# Patient Record
Sex: Female | Born: 1965 | Race: Asian | Hispanic: No | Marital: Married | Smoking: Never smoker
Health system: Southern US, Community
[De-identification: ages and names within clinical notes are randomized; demographics above are authoritative.]

## PROBLEM LIST (undated history)

## (undated) DIAGNOSIS — I10 Essential (primary) hypertension: Secondary | ICD-10-CM

## (undated) HISTORY — DX: Essential (primary) hypertension: I10

---

## 2019-09-28 ENCOUNTER — Ambulatory Visit: Payer: Self-pay | Admitting: Family Medicine

## 2019-10-26 ENCOUNTER — Encounter: Payer: Self-pay | Admitting: Family Medicine

## 2019-10-26 ENCOUNTER — Ambulatory Visit: Payer: Commercial Managed Care - PPO | Admitting: Family Medicine

## 2019-10-26 ENCOUNTER — Ambulatory Visit
Admission: RE | Admit: 2019-10-26 | Discharge: 2019-10-26 | Disposition: A | Discharge status: Home / Self Care | Payer: Commercial Managed Care - PPO | Source: Ambulatory Visit | Attending: Family Medicine | Admitting: Family Medicine

## 2019-10-26 ENCOUNTER — Other Ambulatory Visit: Payer: Self-pay

## 2019-10-26 VITALS — BP 168/102 | HR 71 | Temp 97.9°F | Resp 16 | Ht 59.0 in | Wt 117.4 lb

## 2019-10-26 DIAGNOSIS — Z1231 Encounter for screening mammogram for malignant neoplasm of breast: Secondary | ICD-10-CM

## 2019-10-26 DIAGNOSIS — I10 Essential (primary) hypertension: Secondary | ICD-10-CM | POA: Diagnosis not present

## 2019-10-26 DIAGNOSIS — N926 Irregular menstruation, unspecified: Secondary | ICD-10-CM

## 2019-10-26 DIAGNOSIS — Z789 Other specified health status: Secondary | ICD-10-CM | POA: Diagnosis not present

## 2019-10-26 DIAGNOSIS — N951 Menopausal and female climacteric states: Secondary | ICD-10-CM | POA: Diagnosis not present

## 2019-10-26 MED ORDER — AMLODIPINE BESYLATE 10 MG PO TABS: 10.0000 mg | ORAL_TABLET | Freq: Every day | ORAL | 0 refills | Status: DC

## 2019-10-26 NOTE — Assessment & Plan Note (Signed)
questioning whether or not she is in menopause she has been having irregular.  Hot flashes and inability to sleep well.  [facility]

## 2019-10-26 NOTE — Assessment & Plan Note (Signed)
[facility] 

## 2019-10-26 NOTE — Assessment & Plan Note (Signed)
[place] is second language.  Some confusion and trying to ask questions versus answering what she felt was the answer she needed to say.  She does understand enough English that she cannot function, but will need extra information to make sure she understands.

## 2019-10-26 NOTE — Patient Instructions (Signed)
I appreciate the opportunity to provide you with care for your health and wellness. Today we discussed: establish care   Follow up: 3 months for BP  No labs  Referrals today: GI, and [facility] Orders: Mammogram  Nice to meet you today :)  Please continue to practice social distancing to keep you, your family, and our community safe.  If you must go out, please wear a mask and practice good handwashing.  It was a pleasure to see you and I look forward to continuing to work together on your health and well-being. Please do not hesitate to call the office if you need care or have questions about your care.  Have a wonderful day and week. With Gratitude, Tereasa Coop, DNP, AGNP-BC

## 2019-10-26 NOTE — Assessment & Plan Note (Signed)
Andrea Maldonado is encouraged to maintain a well balanced diet that is low in salt. Not well controlled, increase Norvasc.  Follow-up in 3 months.  Will try to to increase hydrochlorothiazide at that time if still elevated.  I have encouraged her to exercise is beneficial for heart health and control of Blood pressure. 30-60 minutes daily is recommended-walking was suggested.

## 2019-10-26 NOTE — Progress Notes (Signed)
Subjective:  Patient ID: Andrea Maldonado, female    DOB: June 20, 1965  Age: 54 y.o. MRN: [id number]  CC:  Chief Complaint  Patient presents with  . New Patient (Initial Visit)    new pt saw health dept due to no insurance at the time blood pressure is high and she feels like she is going through menopause having irregular periods and hot flashes       HPI  HPI  Andrea Maldonado is a 54 year old female patient.  She presents today to establish care.  Previously was seen at [facility].  Has a history that is very limited in nature hypertension is currently taking Norvasc and hydrochlorothiazide for this.  Still has elevation today at visit.  She reports this is because she works night shift and just got off work and she is tired.  She reports she thinks she is going through menopause as her periods are now irregular, hot flashes, inability to sleep well.  [facility]  She is due for colonoscopy mammogram those are ordered and scheduled today.  She reports trouble hearing out of her left ear but is unsure why it is like that.  She wears glasses has seen eye doctor recently they are up-to-date.  She denies having any trouble with her teeth however she is not seen a dentist.  Denies having any changes in bowel or bladder habits.  Any skin issues or memory issues.   Today patient denies signs and symptoms of COVID 19 infection including fever, chills, cough, shortness of breath, and headache. Past Medical, Surgical, Social History, Allergies, and Medications have been Reviewed.   Past Medical History:  Diagnosis Date  . Hypertension     Current Meds  Medication Sig  . amLODipine (NORVASC) 10 MG tablet Take 1 tablet (10 mg total) by mouth daily.  . hydrochlorothiazide (MICROZIDE) 12.5 MG capsule Take 12.5 mg by mouth daily.  . [DISCONTINUED] amLODipine (NORVASC) 5 MG tablet Take 5 mg by mouth 2 (two) times daily.    ROS:    Review of Systems  Constitutional: Negative.   HENT: Negative.   Eyes: Negative.   Respiratory: Negative.   Cardiovascular: Negative.   Gastrointestinal: Negative.   Genitourinary: Negative.   Musculoskeletal: Negative.   Skin: Negative.   Neurological: Negative.        Hot flashes  Endo/Heme/Allergies: Negative.   Psychiatric/Behavioral: The patient has insomnia.   All other systems reviewed and are negative.    Objective:   Today's Vitals: BP (!) 168/102   Pulse 71   Temp 97.9 F (36.6 C) (Temporal)   Resp 16   Ht 4\' 11"  (1.499 m)   Wt 117 lb 6.4 oz (53.3 kg)   LMP 08/02/2019 (Approximate)   SpO2 99%   BMI 23.71 kg/m  Vitals with BMI 10/26/2019 10/26/2019  Height - 4\' 11"   Weight - 117 lbs 6 oz  BMI - 23.7  Systolic 168 172  Diastolic 102 106  Pulse - 71     Physical Exam Vitals and nursing note reviewed.  Constitutional:      Appearance: Normal appearance. She is well-developed, well-groomed and normal weight.  HENT:     Head: Normocephalic and atraumatic.     Right Ear: External ear normal.     Left Ear: External ear normal.     Mouth/Throat:     Comments: Mask in place  Eyes:     General:  Right eye: No discharge.        Left eye: No discharge.     Conjunctiva/sclera: Conjunctivae normal.  Cardiovascular:     Rate and Rhythm: Normal rate and regular rhythm.     Pulses: Normal pulses.     Heart sounds: Normal heart sounds.  Pulmonary:     Effort: Pulmonary effort is normal.     Breath sounds: Normal breath sounds.  Musculoskeletal:        General: Normal range of motion.     Cervical back: Normal range of motion and neck supple.  Skin:    General: Skin is warm.  Neurological:     General: No focal deficit present.     Mental Status: She is alert and oriented to person, place, and time.  Psychiatric:        Attention and Perception: Attention normal.        Mood and Affect: Mood normal.        Speech: Speech normal.        Behavior:  Behavior normal. Behavior is cooperative.        Thought Content: Thought content normal.        Cognition and Memory: Cognition normal.        Judgment: Judgment normal.     Assessment   1. Essential hypertension   2. Screening mammogram, encounter for   3. Language barrier   4. Hot flashes due to menopause   5. Irregular periods     Tests ordered Orders Placed This Encounter  Procedures  . MM Digital Screening     Plan: Please see assessment and plan per problem list above.   Meds ordered this encounter  Medications  . amLODipine (NORVASC) 10 MG tablet    Sig: Take 1 tablet (10 mg total) by mouth daily.    Dispense:  90 tablet    Refill:  0    Order Specific Question:   Supervising Provider    Answer:   Kerri Perches [[id number]]    Patient to follow-up in 3 months for BP .  Andrea Finner, NP

## 2019-10-29 ENCOUNTER — Encounter (INDEPENDENT_AMBULATORY_CARE_PROVIDER_SITE_OTHER): Payer: Self-pay | Admitting: *Deleted

## 2019-11-15 ENCOUNTER — Encounter: Payer: Self-pay | Admitting: Obstetrics & Gynecology

## 2019-11-15 ENCOUNTER — Ambulatory Visit: Payer: Commercial Managed Care - PPO | Admitting: Obstetrics & Gynecology

## 2019-11-15 VITALS — BP 161/89 | HR 74 | Ht 59.0 in | Wt 118.0 lb

## 2019-11-15 DIAGNOSIS — N951 Menopausal and female climacteric states: Secondary | ICD-10-CM

## 2019-11-15 MED ORDER — PROGESTERONE 200 MG PO CAPS: 200.0000 mg | ORAL_CAPSULE | Freq: Every day | ORAL | 0 refills | Status: DC

## 2019-11-15 NOTE — Progress Notes (Signed)
Chief Complaint  Patient presents with  . Menopause symptoms    hot flashes, trouble sleeping, irregular bleeding      54 y.o. No obstetric history on file. Patient's last menstrual period was 10/02/2019. The current method of family planning is none.  Outpatient Encounter Medications as of 11/15/2019  Medication Sig  . amLODipine (NORVASC) 10 MG tablet Take 1 tablet (10 mg total) by mouth daily.  . hydrochlorothiazide (MICROZIDE) 12.5 MG capsule Take 12.5 mg by mouth daily.  . progesterone (PROMETRIUM) 200 MG capsule Take 1 capsule (200 mg total) by mouth daily. At bedtime   No facility-administered encounter medications on file as of 11/15/2019.    Subjective Pt with several months of increasing vasomotor symtpoms and menopausal symptoms irreguylar bleeding the last couple of years, 2-3 menses per year Worsening  Past Medical History:  Diagnosis Date  . Hypertension     History reviewed. No pertinent surgical history.  OB History   No obstetric history on file.     Not on File  Social History   Socioeconomic History  . Marital status: Married    Spouse name: Fayrene Fearing   . Number of children: 1  . Years of education: Not on file  . Highest education level: Not on file  Occupational History    Comment: Paediatric nurse   Tobacco Use  . Smoking status: Never Smoker  . Smokeless tobacco: Never Used  Vaping Use  . Vaping Use: Never used  Substance and Sexual Activity  . Alcohol use: Never  . Drug use: Never  . Sexual activity: Yes    Birth control/protection: None  Other Topics Concern  . Not on file  Social History Narrative   Lives with Fayrene Fearing husband of 6 years    Daughter lives in [place]       Enjoys: movies       Diet: eats food groups    Caffeine: 2 cups coffee, tea once a day but not every day   Water: 4 cups daily       Wears seat belt   Does not drive    Smoke detectors    No weapons    Social Determinants of Health    Financial Resource Strain: Low Risk   . Difficulty of Paying Living Expenses: Not hard at all  Food Insecurity: No Food Insecurity  . Worried About Programme researcher, broadcasting/film/video in the Last Year: Never true  . Ran Out of Food in the Last Year: Never true  Transportation Needs: No Transportation Needs  . Lack of Transportation (Medical): No  . Lack of Transportation (Non-Medical): No  Physical Activity: Insufficiently Active  . Days of Exercise per Week: 3 days  . Minutes of Exercise per Session: 30 min  Stress: No Stress Concern Present  . Feeling of Stress : Not at all  Social Connections: Moderately Integrated  . Frequency of Communication with Friends and Family: More than three times a week  . Frequency of Social Gatherings with Friends and Family: More than three times a week  . Attends Religious Services: More than 4 times per year  . Active Member of Clubs or Organizations: No  . Attends Banker Meetings: Never  . Marital Status: Married    Family History  Problem Relation Age of Onset  . Breast cancer Mother     Medications:       Current Outpatient Medications:  .  amLODipine (NORVASC) 10 MG tablet, Take  1 tablet (10 mg total) by mouth daily., Disp: 90 tablet, Rfl: 0 .  hydrochlorothiazide (MICROZIDE) 12.5 MG capsule, Take 12.5 mg by mouth daily., Disp: , Rfl:  .  progesterone (PROMETRIUM) 200 MG capsule, Take 1 capsule (200 mg total) by mouth daily. At bedtime, Disp: 30 capsule, Rfl: 0  Objective Blood pressure (!) 161/89, pulse 74, height 4\' 11"  (1.499 m), weight 118 lb (53.5 kg), last menstrual period 10/02/2019.  Gen WDWN NAD  Pertinent ROS No burning with urination, frequency or urgency No nausea, vomiting or diarrhea Nor fever chills or other constitutional symptoms   Labs or studies     Impression Diagnoses this Encounter:: No diagnosis found.  Established relevant diagnosis(es):   Plan/Recommendations: Meds ordered this encounter   Medications  . progesterone (PROMETRIUM) 200 MG capsule    Sig: Take 1 capsule (200 mg total) by mouth daily. At bedtime    Dispense:  30 capsule    Refill:  0    Labs or Scans Ordered: No orders of the defined types were placed in this encounter.   Management:: Prometrium for endometrial synchonization  Then  Will begin HRT, estrogen plus progesterone  Follow up Return in about 6 weeks (around 12/27/2019) for Follow up, with Dr 02/26/2020.        Face to face time:  20 minutes  Greater than 50% of the visit time was spent in counseling and coordination of care with the patient.  The summary and outline of the counseling and care coordination is summarized in the note above.   All questions were answered.

## 2019-12-28 ENCOUNTER — Ambulatory Visit: Payer: Commercial Managed Care - PPO | Admitting: Obstetrics & Gynecology

## 2020-01-04 ENCOUNTER — Encounter: Payer: Self-pay | Admitting: Obstetrics & Gynecology

## 2020-01-04 ENCOUNTER — Ambulatory Visit (INDEPENDENT_AMBULATORY_CARE_PROVIDER_SITE_OTHER): Payer: Commercial Managed Care - PPO | Admitting: Obstetrics & Gynecology

## 2020-01-04 VITALS — BP 145/89 | HR 73 | Wt 119.0 lb

## 2020-01-04 DIAGNOSIS — N951 Menopausal and female climacteric states: Secondary | ICD-10-CM | POA: Diagnosis not present

## 2020-01-04 MED ORDER — PROGESTERONE 200 MG PO CAPS
200.0000 mg | ORAL_CAPSULE | Freq: Every day | ORAL | 11 refills | Status: DC
Start: 2020-01-04 — End: 2023-12-01

## 2020-01-04 MED ORDER — ESTRADIOL 2 MG PO TABS: 2.0000 mg | ORAL_TABLET | Freq: Every day | ORAL | 11 refills | Status: DC

## 2020-01-04 NOTE — Progress Notes (Signed)
Chief Complaint  Patient presents with  . Follow-up    still having hot flashes      54 y.o. No obstetric history on file. No LMP recorded. (Menstrual status: Perimenopausal). The current method of family planning is post menopausal status.  Outpatient Encounter Medications as of 01/04/2020  Medication Sig  . amLODipine (NORVASC) 10 MG tablet Take 1 tablet (10 mg total) by mouth daily.  . hydrochlorothiazide (MICROZIDE) 12.5 MG capsule Take 12.5 mg by mouth daily.  . [DISCONTINUED] progesterone (PROMETRIUM) 200 MG capsule Take 1 capsule (200 mg total) by mouth daily. At bedtime  . estradiol (ESTRACE) 2 MG tablet Take 1 tablet (2 mg total) by mouth daily.  . progesterone (PROMETRIUM) 200 MG capsule Take 1 capsule (200 mg total) by mouth daily. At bedtime   No facility-administered encounter medications on file as of 01/04/2020.    Subjective Pt has stopped bleeding on prometrium hot flashes continue No other complaints Past Medical History:  Diagnosis Date  . Hypertension     History reviewed. No pertinent surgical history.  OB History   No obstetric history on file.     No Known Allergies  Social History   Socioeconomic History  . Marital status: Married    Spouse name: Fayrene Fearing   . Number of children: 1  . Years of education: Not on file  . Highest education level: Not on file  Occupational History    Comment: Paediatric nurse   Tobacco Use  . Smoking status: Never Smoker  . Smokeless tobacco: Never Used  Vaping Use  . Vaping Use: Never used  Substance and Sexual Activity  . Alcohol use: Never  . Drug use: Never  . Sexual activity: Yes    Birth control/protection: None  Other Topics Concern  . Not on file  Social History Narrative   Lives with Fayrene Fearing husband of 6 years    Daughter lives in [place]       Enjoys: movies       Diet: eats food groups    Caffeine: 2 cups coffee, tea once a day but not every day   Water: 4 cups daily        Wears seat belt   Does not drive    Smoke detectors    No weapons    Social Determinants of Health   Financial Resource Strain: Low Risk   . Difficulty of Paying Living Expenses: Not hard at all  Food Insecurity: No Food Insecurity  . Worried About Programme researcher, broadcasting/film/video in the Last Year: Never true  . Ran Out of Food in the Last Year: Never true  Transportation Needs: No Transportation Needs  . Lack of Transportation (Medical): No  . Lack of Transportation (Non-Medical): No  Physical Activity: Insufficiently Active  . Days of Exercise per Week: 3 days  . Minutes of Exercise per Session: 30 min  Stress: No Stress Concern Present  . Feeling of Stress : Not at all  Social Connections: Moderately Integrated  . Frequency of Communication with Friends and Family: More than three times a week  . Frequency of Social Gatherings with Friends and Family: More than three times a week  . Attends Religious Services: More than 4 times per year  . Active Member of Clubs or Organizations: No  . Attends Banker Meetings: Never  . Marital Status: Married    Family History  Problem Relation Age of Onset  . Breast cancer Mother  Medications:       Current Outpatient Medications:  .  amLODipine (NORVASC) 10 MG tablet, Take 1 tablet (10 mg total) by mouth daily., Disp: 90 tablet, Rfl: 0 .  hydrochlorothiazide (MICROZIDE) 12.5 MG capsule, Take 12.5 mg by mouth daily., Disp: , Rfl:  .  estradiol (ESTRACE) 2 MG tablet, Take 1 tablet (2 mg total) by mouth daily., Disp: 30 tablet, Rfl: 11 .  progesterone (PROMETRIUM) 200 MG capsule, Take 1 capsule (200 mg total) by mouth daily. At bedtime, Disp: 30 capsule, Rfl: 11  Objective Blood pressure (!) 145/89, pulse 73, weight 119 lb (54 kg).  Gen WDWN NAD  Pertinent ROS No burning with urination, frequency or urgency No nausea, vomiting or diarrhea Nor fever chills or other constitutional symptoms   Labs or  studies     Impression Diagnoses this Encounter::   ICD-10-CM   1. Menopausal symptoms  N95.1     Established relevant diagnosis(es):   Plan/Recommendations: Meds ordered this encounter  Medications  . estradiol (ESTRACE) 2 MG tablet    Sig: Take 1 tablet (2 mg total) by mouth daily.    Dispense:  30 tablet    Refill:  11  . progesterone (PROMETRIUM) 200 MG capsule    Sig: Take 1 capsule (200 mg total) by mouth daily. At bedtime    Dispense:  30 capsule    Refill:  11    Labs or Scans Ordered: No orders of the defined types were placed in this encounter.   Management:: Bleeding managed effectively with the prometrium only  Now  Will transition to managing her vasomotor symptoms with estradiol 2 mg daily and prometrium 200 mg daily  Follow up No follow-ups on file.        Face to face time:  10 minutes  Greater than 50% of the visit time was spent in counseling and coordination of care with the patient.  The summary and outline of the counseling and care coordination is summarized in the note above.   All questions were answered.

## 2020-01-29 ENCOUNTER — Encounter: Payer: Self-pay | Admitting: Family Medicine

## 2020-01-29 ENCOUNTER — Ambulatory Visit (INDEPENDENT_AMBULATORY_CARE_PROVIDER_SITE_OTHER): Payer: Commercial Managed Care - PPO | Admitting: Family Medicine

## 2020-01-29 ENCOUNTER — Other Ambulatory Visit: Payer: Self-pay

## 2020-01-29 VITALS — BP 167/117 | HR 68 | Temp 97.9°F | Ht 59.0 in | Wt 120.0 lb

## 2020-01-29 DIAGNOSIS — I10 Essential (primary) hypertension: Secondary | ICD-10-CM

## 2020-01-29 MED ORDER — LISINOPRIL-HYDROCHLOROTHIAZIDE 10-12.5 MG PO TABS: 1.0000 | ORAL_TABLET | Freq: Every day | ORAL | 3 refills | Status: DC

## 2020-01-29 NOTE — Progress Notes (Signed)
Subjective:  Patient ID: Andrea Maldonado, female    DOB: April 13, 1965  Age: 54 y.o. MRN: [id number]  CC:  Chief Complaint  Patient presents with  . Follow-up    b/p      HPI  HPI  Andrea Maldonado is here for follow up on BP. She still has a elevation, but reports not eating salt in her diet and taking medication as directed. Exercising not really outside of working. Blood pressure is not checked at home.  Risk factors: hypertension. Use of agents associated with hypertension: estrogens. History of target organ damage: none. Concerned about not seeing an improvement post medication adjustment. She is willing to have me adjust the medications again.  She recently was started on estrace due to menopausal symptoms. I am concerned and will look at stopping this if no improvement seen in future.  Today patient denies signs and symptoms of COVID 19 infection including fever, chills, cough, shortness of breath, and headache. Past Medical, Surgical, Social History, Allergies, and Medications have been Reviewed.   Past Medical History:  Diagnosis Date  . Hypertension     Current Meds  Medication Sig  . amLODipine (NORVASC) 10 MG tablet Take 1 tablet (10 mg total) by mouth daily.  Marland Kitchen estradiol (ESTRACE) 2 MG tablet Take 1 tablet (2 mg total) by mouth daily.  . progesterone (PROMETRIUM) 200 MG capsule Take 1 capsule (200 mg total) by mouth daily. At bedtime  . [DISCONTINUED] hydrochlorothiazide (MICROZIDE) 12.5 MG capsule Take 12.5 mg by mouth daily.    ROS:  Review of Systems  Constitutional: Negative.   HENT: Negative.   Eyes: Negative.   Respiratory: Negative.   Cardiovascular: Negative.   Gastrointestinal: Negative.   Genitourinary: Negative.   Musculoskeletal: Negative.   Skin: Negative.   Neurological: Negative.   Endo/Heme/Allergies: Negative.   Psychiatric/Behavioral: Negative.      Objective:   Today's Vitals: BP (!) 167/117 (BP Location: Left Arm, Patient Position:  Sitting, Cuff Size: Normal)   Pulse 68   Temp 97.9 F (36.6 C) (Tympanic)   Ht 4\' 11"  (1.499 m)   Wt 120 lb (54.4 kg)   SpO2 97%   BMI 24.24 kg/m  Vitals with BMI 01/29/2020 01/04/2020 11/15/2019  Height 4\' 11"  - 4\' 11"   Weight 120 lbs 119 lbs 118 lbs  BMI 24.22 - 23.82  Systolic 167 145 11/17/2019  Diastolic 117 89 89  Pulse 68 73 74     Physical Exam Vitals and nursing note reviewed.  Constitutional:      Appearance: Normal appearance. She is well-developed, well-groomed and normal weight.  HENT:     Head: Normocephalic and atraumatic.     Right Ear: External ear normal.     Left Ear: External ear normal.     Mouth/Throat:     Comments: Mask in place  Eyes:     General:        Right eye: No discharge.        Left eye: No discharge.     Conjunctiva/sclera: Conjunctivae normal.  Cardiovascular:     Rate and Rhythm: Normal rate and regular rhythm.     Pulses: Normal pulses.     Heart sounds: Normal heart sounds.  Pulmonary:     Effort: Pulmonary effort is normal.     Breath sounds: Normal breath sounds.  Musculoskeletal:        General: Normal range of motion.     Cervical back: Normal range of motion and neck  supple.  Skin:    General: Skin is warm.  Neurological:     General: No focal deficit present.     Mental Status: She is alert and oriented to person, place, and time.  Psychiatric:        Attention and Perception: Attention normal.        Mood and Affect: Mood normal.        Speech: Speech normal.        Behavior: Behavior normal. Behavior is cooperative.        Thought Content: Thought content normal.        Cognition and Memory: Cognition normal.        Judgment: Judgment normal.     Assessment   1. Essential hypertension     Tests ordered No orders of the defined types were placed in this encounter.  Plan: Please see assessment and plan per problem list above.   Meds ordered this encounter  Medications  . lisinopril-hydrochlorothiazide  (ZESTORETIC) 10-12.5 MG tablet    Sig: Take 1 tablet by mouth daily.    Dispense:  90 tablet    Refill:  3    Order Specific Question:   Supervising Provider    Answer:   MARILLA, BODDY [[id number]]    Patient to follow-up in 03/18/2020  Note: This dictation was prepared with Dragon dictation along with smaller phrase technology. Similar sounding words can be transcribed inadequately or may not be corrected upon review. Any transcriptional errors that result from this process are unintentional.      Freddy Finner, NP

## 2020-01-29 NOTE — Patient Instructions (Signed)
  HAPPY FALL!  I appreciate the opportunity to provide you with care for your health and wellness. Today we discussed: Blood pressure  Follow up: 6-8 weeks for BP check  No labs or referrals today  Medication changes: Please review the one we stop and the new one we started  Please continue to practice social distancing to keep you, your family, and our community safe.  If you must go out, please wear a mask and practice good handwashing.  It was a pleasure to see you and I look forward to continuing to work together on your health and well-being. Please do not hesitate to call the office if you need care or have questions about your care.  Have a wonderful day and week. With Gratitude, Tereasa Coop, DNP, AGNP-BC

## 2020-01-30 NOTE — Assessment & Plan Note (Signed)
Starting zestoretic to add to norvasc. If no improvement will look at increase dosage and stopping recently started estrace to prevent complications in the future. I encouraged exercise and continuous focus on diet. Additionally, I will be getting updated labs at next visit.

## 2020-02-10 ENCOUNTER — Emergency Department (HOSPITAL_COMMUNITY)
Admission: EM | Admit: 2020-02-10 | Discharge: 2020-02-10 | Disposition: A | Discharge status: Home / Self Care | Payer: Commercial Managed Care - PPO | Attending: Emergency Medicine | Admitting: Emergency Medicine

## 2020-02-10 ENCOUNTER — Emergency Department (HOSPITAL_COMMUNITY): Payer: Commercial Managed Care - PPO

## 2020-02-10 ENCOUNTER — Encounter (HOSPITAL_COMMUNITY): Payer: Self-pay

## 2020-02-10 ENCOUNTER — Other Ambulatory Visit: Payer: Self-pay

## 2020-02-10 DIAGNOSIS — I10 Essential (primary) hypertension: Secondary | ICD-10-CM | POA: Insufficient documentation

## 2020-02-10 DIAGNOSIS — N946 Dysmenorrhea, unspecified: Secondary | ICD-10-CM | POA: Diagnosis not present

## 2020-02-10 DIAGNOSIS — N939 Abnormal uterine and vaginal bleeding, unspecified: Secondary | ICD-10-CM | POA: Insufficient documentation

## 2020-02-10 DIAGNOSIS — Z79899 Other long term (current) drug therapy: Secondary | ICD-10-CM | POA: Insufficient documentation

## 2020-02-10 LAB — URINALYSIS, ROUTINE W REFLEX MICROSCOPIC

## 2020-02-10 LAB — I-STAT BETA HCG BLOOD, ED (MC, WL, AP ONLY): I-stat hCG, quantitative: <5 m[IU]/mL (ref <5)

## 2020-02-10 LAB — COMPREHENSIVE METABOLIC PANEL
ALT: 20 U/L (ref 0–44)
AST: 24 U/L (ref 15–41)
Albumin: 4.3 g/dL (ref 3.5–5.0)
Alkaline Phosphatase: 62 U/L (ref 38–126)
Anion gap: 8 (ref 5–15)
BUN: 14 mg/dL (ref 6–20)
CO2: 28 mmol/L (ref 22–32)
Calcium: 9.3 mg/dL (ref 8.9–10.3)
Chloride: 102 mmol/L (ref 98–111)
Creatinine, Ser: 0.63 mg/dL (ref 0.44–1.00)
GFR, Estimated: >60 mL/min (ref >60)
Glucose, Bld: 104 mg/dL — ABNORMAL HIGH (ref 70–99)
Potassium: 3.7 mmol/L (ref 3.5–5.1)
Sodium: 138 mmol/L (ref 135–145)
Total Bilirubin: 0.8 mg/dL (ref 0.3–1.2)
Total Protein: 8 g/dL (ref 6.5–8.1)

## 2020-02-10 LAB — CBC WITH DIFFERENTIAL/PLATELET
Abs Immature Granulocytes: 0.02 10*3/uL (ref 0.00–0.07)
Basophils Absolute: 0.1 10*3/uL (ref 0.0–0.1)
Basophils Relative: 1 %
Eosinophils Absolute: 0.2 10*3/uL (ref 0.0–0.5)
Eosinophils Relative: 2 %
HCT: 43.1 % (ref 36.0–46.0)
Hemoglobin: 14.7 g/dL (ref 12.0–15.0)
Immature Granulocytes: 0 %
Lymphocytes Relative: 20 %
Lymphs Abs: 1.8 10*3/uL (ref 0.7–4.0)
MCH: 31.1 pg (ref 26.0–34.0)
MCHC: 34.1 g/dL (ref 30.0–36.0)
MCV: 91.3 fL (ref 80.0–100.0)
Monocytes Absolute: 0.4 10*3/uL (ref 0.1–1.0)
Monocytes Relative: 4 %
Neutro Abs: 6.5 10*3/uL (ref 1.7–7.7)
Neutrophils Relative %: 73 %
Platelets: 466 10*3/uL — ABNORMAL HIGH (ref 150–400)
RBC: 4.72 MIL/uL (ref 3.87–5.11)
RDW: 11.3 % — ABNORMAL LOW (ref 11.5–15.5)
WBC: 8.9 10*3/uL (ref 4.0–10.5)
nRBC: 0 % (ref 0.0–0.2)

## 2020-02-10 LAB — URINALYSIS, MICROSCOPIC (REFLEX): RBC / HPF: >50 RBC/hpf (ref 0–5)

## 2020-02-10 MED ORDER — KETOROLAC TROMETHAMINE 60 MG/2ML IM SOLN
60.0000 mg | Freq: Once | INTRAMUSCULAR | Status: AC
  Administered 2020-02-10: 60 mg via INTRAMUSCULAR
  Filled 2020-02-10: qty 2

## 2020-02-10 NOTE — ED Notes (Signed)
Pt given breakfast tray

## 2020-02-10 NOTE — ED Notes (Signed)
To US

## 2020-02-10 NOTE — Discharge Instructions (Signed)
Ibuprofen for cramps.  Call Dr. Forestine Chute office to schedule appointment for recheck

## 2020-02-10 NOTE — ED Provider Notes (Signed)
[facility] EMERGENCY DEPARTMENT Provider Note   CSN: [id number] Arrival date & time: 02/10/20  2426     History Chief Complaint  Patient presents with  . Vaginal Bleeding    Andrea Maldonado is a 54 y.o. female.  Pt reports she has menopausal symptoms.  Pt complains of vaginal bleeding and abdominal pain.  Pt recently saw Dr. Despina Hidden and was started on medication.  Pt reports she did not take her blood pressure medication today.    The history is provided by the patient. No language interpreter was used.  Vaginal Bleeding Quality:  Bright red Severity:  Moderate Onset quality:  Gradual Timing:  Constant Progression:  Worsening Chronicity:  New Possible pregnancy: no   Relieved by:  Nothing Ineffective treatments:  None tried Associated symptoms: abdominal pain   Risk factors: no gynecological surgery        Past Medical History:  Diagnosis Date  . Hypertension     Patient Active Problem List   Diagnosis Date Noted  . Essential hypertension 10/26/2019  . Screening mammogram, encounter for 10/26/2019  . Language barrier 10/26/2019  . Hot flashes due to menopause 10/26/2019  . Irregular periods 10/26/2019    History reviewed. No pertinent surgical history.   OB History   No obstetric history on file.     Family History  Problem Relation Age of Onset  . Breast cancer Mother     Social History   Tobacco Use  . Smoking status: Never Smoker  . Smokeless tobacco: Never Used  Vaping Use  . Vaping Use: Never used  Substance Use Topics  . Alcohol use: Never  . Drug use: Never    Home Medications Prior to Admission medications   Medication Sig Start Date End Date Taking? Authorizing Provider  amLODipine (NORVASC) 10 MG tablet Take 1 tablet (10 mg total) by mouth daily. 10/26/19   Freddy Finner, NP  estradiol (ESTRACE) 2 MG tablet Take 1 tablet (2 mg total) by mouth daily. 01/04/20   Lazaro Arms, MD  lisinopril-hydrochlorothiazide (ZESTORETIC) 10-12.5  MG tablet Take 1 tablet by mouth daily. 01/29/20   Freddy Finner, NP  progesterone (PROMETRIUM) 200 MG capsule Take 1 capsule (200 mg total) by mouth daily. At bedtime 01/04/20   Lazaro Arms, MD    Allergies    Patient has no known allergies.  Review of Systems   Review of Systems  Gastrointestinal: Positive for abdominal pain.  Genitourinary: Positive for vaginal bleeding.  All other systems reviewed and are negative.   Physical Exam Updated Vital Signs BP 136/80   Pulse 74   Temp 98.9 F (37.2 C) (Oral)   Resp 12   Ht 4\' 11"  (1.499 m)   Wt 54 kg   SpO2 100%   BMI 24.04 kg/m   Physical Exam Vitals and nursing note reviewed.  Constitutional:      Appearance: She is well-developed.  HENT:     Head: Normocephalic.  Cardiovascular:     Rate and Rhythm: Normal rate.  Pulmonary:     Effort: Pulmonary effort is normal.  Abdominal:     General: There is no distension.  Musculoskeletal:        General: Normal range of motion.     Cervical back: Normal range of motion.  Skin:    General: Skin is warm.  Neurological:     General: No focal deficit present.     Mental Status: She is alert and oriented to person, place, and  time.  Psychiatric:        Mood and Affect: Mood normal.     ED Results / Procedures / Treatments   Labs (all labs ordered are listed, but only abnormal results are displayed) Labs Reviewed  CBC WITH DIFFERENTIAL/PLATELET - Abnormal; Notable for the following components:      Result Value   RDW 11.3 (*)    Platelets 466 (*)    All other components within normal limits  COMPREHENSIVE METABOLIC PANEL - Abnormal; Notable for the following components:   Glucose, Bld 104 (*)    All other components within normal limits  URINALYSIS, ROUTINE W REFLEX MICROSCOPIC - Abnormal; Notable for the following components:   Color, Urine RED (*)    APPearance TURBID (*)    Glucose, UA   (*)    Value: TEST NOT REPORTED DUE TO COLOR INTERFERENCE OF URINE  PIGMENT   Hgb urine dipstick   (*)    Value: TEST NOT REPORTED DUE TO COLOR INTERFERENCE OF URINE PIGMENT   Bilirubin Urine   (*)    Value: TEST NOT REPORTED DUE TO COLOR INTERFERENCE OF URINE PIGMENT   Ketones, ur   (*)    Value: TEST NOT REPORTED DUE TO COLOR INTERFERENCE OF URINE PIGMENT   Protein, ur   (*)    Value: TEST NOT REPORTED DUE TO COLOR INTERFERENCE OF URINE PIGMENT   Nitrite   (*)    Value: TEST NOT REPORTED DUE TO COLOR INTERFERENCE OF URINE PIGMENT   Leukocytes,Ua   (*)    Value: TEST NOT REPORTED DUE TO COLOR INTERFERENCE OF URINE PIGMENT   All other components within normal limits  URINALYSIS, MICROSCOPIC (REFLEX) - Abnormal; Notable for the following components:   Bacteria, UA MANY (*)    All other components within normal limits  I-STAT BETA HCG BLOOD, ED ([facility], [facility], [facility] ONLY)    EKG EKG Interpretation  Date/Time:  Sunday February 10 2020 09:06:39 EST Ventricular Rate:  88 PR Interval:    QRS Duration: 85 QT Interval:  345 QTC Calculation: 418 R Axis:   78 Text Interpretation: Sinus rhythm Right atrial enlargement Borderline repolarization abnormality No old tracing to compare Confirmed by Mancel Bale [id number]) on 02/10/2020 9:09:46 AM   Radiology No results found.  Procedures Procedures (including critical care time)  Medications Ordered in ED Medications - No data to display  ED Course  I have reviewed the triage vital signs and the nursing notes.  Pertinent labs & imaging results that were available during my care of the patient were reviewed by me and considered in my medical decision making (see chart for details).    MDM Rules/Calculators/A&P                         MDM: Pt took am medication and blood pressure improved  Normal hemoglobin.  Ultrasound no abnormality.  Pt advised to continue current medications.  Call Dr. Alyssa Grove office tomorrow to schedule followup.  I will try giving toradol to help with cramping  Final Clinical Impression(s)  / ED Diagnoses Final diagnoses:  Dysmenorrhea  Vaginal bleeding  Hypertension, unspecified type    Rx / DC Orders ED Discharge Orders    None    An After Visit Summary was printed and given to the patient.    Elson Areas, Cordelia Poche 02/10/20 1229    Mancel Bale, MD 02/11/20 1414

## 2020-02-10 NOTE — ED Triage Notes (Signed)
Pt reports irregular vaginal bleeding, dizziness, and vomiting.  ALso reports fatigue since Saturday.

## 2020-02-10 NOTE — ED Notes (Signed)
Report received 

## 2020-02-10 NOTE — ED Notes (Signed)
Pt in BR emptying bladder.

## 2020-03-18 ENCOUNTER — Ambulatory Visit: Payer: Commercial Managed Care - PPO | Admitting: Family Medicine

## 2020-03-18 ENCOUNTER — Other Ambulatory Visit: Payer: Self-pay

## 2020-03-18 ENCOUNTER — Encounter: Payer: Self-pay | Admitting: Family Medicine

## 2020-03-18 VITALS — BP 144/80 | HR 68 | Temp 97.9°F | Ht 59.0 in | Wt 122.0 lb

## 2020-03-18 DIAGNOSIS — H01004 Unspecified blepharitis left upper eyelid: Secondary | ICD-10-CM

## 2020-03-18 DIAGNOSIS — H01001 Unspecified blepharitis right upper eyelid: Secondary | ICD-10-CM | POA: Diagnosis not present

## 2020-03-18 DIAGNOSIS — I10 Essential (primary) hypertension: Secondary | ICD-10-CM

## 2020-03-18 MED ORDER — BACITRACIN-POLYMYXIN B 500-10000 UNIT/GM OP OINT: 1.0000 "application " | TOPICAL_OINTMENT | Freq: Two times a day (BID) | OPHTHALMIC | 0 refills | Status: DC

## 2020-03-18 NOTE — Patient Instructions (Signed)
I appreciate the opportunity to provide you with care for your health and wellness. Today we discussed: BP   Follow up: 3-4 months for BP check (can be with anyone if I am out at that time)  No labs or referrals today  Continue your BP medication lets follow up in 3 months to make sure all is going well still.  Use ointment sent in for eyes, if not better call.   Happy New Year!  Please continue to practice social distancing to keep you, your family, and our community safe.  If you must go out, please wear a mask and practice good handwashing.  It was a pleasure to see you and I look forward to continuing to work together on your health and well-being. Please do not hesitate to call the office if you need care or have questions about your care.  Have a wonderful day. With Gratitude, Tereasa Coop, DNP, AGNP-BC

## 2020-03-18 NOTE — Progress Notes (Signed)
Subjective:  Patient ID: Andrea Maldonado, female    DOB: [birth year]  Age: 54 y.o. MRN: [id number]  CC:  Chief Complaint  Patient presents with  . Hypertension    F/u  . Eye Problem    Both eyes itching, some redness around the R eye      HPI  HPI Andrea Maldonado is here for follow up on BP. She still has a elevation, but reports not eating salt in her diet and taking medication as directed. Exercising not really outside of working. Blood pressure is not checked at home.  Risk factors: hypertension. Use of agents associated with hypertension: estrogens. History of target organ damage: none. Concerned about not seeing an improvement post medication adjustment. She is willing to have me adjust the medications again.  She reports that she has a rash on both eye lids and some redness, more about Right eye than left. Very itch.  No pain with eye movement. No pain with blinking. No crusting over or drainage.  Today patient denies signs and symptoms of COVID 19 infection including fever, chills, cough, shortness of breath, and headache. Past Medical, Surgical, Social History, Allergies, and Medications have been Reviewed.   Past Medical History:  Diagnosis Date  . Hypertension     Current Meds  Medication Sig  . amLODipine (NORVASC) 10 MG tablet Take 1 tablet (10 mg total) by mouth daily.  . bacitracin-polymyxin b (POLYSPORIN) ophthalmic ointment Place 1 application into both eyes 2 (two) times daily. apply to eye every 12 hours while awake  . estradiol (ESTRACE) 2 MG tablet Take 1 tablet (2 mg total) by mouth daily.  Marland Kitchen lisinopril-hydrochlorothiazide (ZESTORETIC) 10-12.5 MG tablet Take 1 tablet by mouth daily.  . progesterone (PROMETRIUM) 200 MG capsule Take 1 capsule (200 mg total) by mouth daily. At bedtime    ROS:  Review of Systems  Constitutional: Negative.   HENT: Negative.        Itch eyes see HPI  Eyes: Negative.   Respiratory: Negative.   Cardiovascular: Negative.    Gastrointestinal: Negative.   Genitourinary: Negative.   Musculoskeletal: Negative.   Skin: Negative.   Neurological: Negative.   Endo/Heme/Allergies: Negative.   Psychiatric/Behavioral: Negative.      Objective:   Today's Vitals: BP (!) 144/80 (BP Location: Right Arm, Patient Position: Sitting, Cuff Size: Normal)   Pulse 68   Temp 97.9 F (36.6 C) (Temporal)   Ht 4\' 11"  (1.499 m)   Wt 122 lb (55.3 kg)   SpO2 100%   BMI 24.64 kg/m  Vitals with BMI 03/18/2020 02/10/2020 02/10/2020  Height 4\' 11"  - -  Weight 122 lbs - -  BMI 24.63 - -  Systolic 144 150 02/12/2020  Diastolic 80 90 80  Pulse 68 72 74     Physical Exam Vitals and nursing note reviewed.  Constitutional:      Appearance: Normal appearance. She is well-developed and well-groomed. She is obese.  HENT:     Head: Normocephalic and atraumatic.     Right Ear: External ear normal.     Left Ear: External ear normal.     Mouth/Throat:     Comments: Mask in place  Eyes:     General:        Right eye: No discharge.        Left eye: No discharge.     Conjunctiva/sclera: Conjunctivae normal.     Comments: Redness of the eye lid   Cardiovascular:  Rate and Rhythm: Normal rate and regular rhythm.     Pulses: Normal pulses.     Heart sounds: Normal heart sounds.  Pulmonary:     Effort: Pulmonary effort is normal.     Breath sounds: Normal breath sounds.  Musculoskeletal:        General: Normal range of motion.     Cervical back: Normal range of motion and neck supple.  Skin:    General: Skin is warm.  Neurological:     General: No focal deficit present.     Mental Status: She is alert and oriented to person, place, and time.  Psychiatric:        Attention and Perception: Attention and perception normal.        Mood and Affect: Mood and affect normal.        Speech: Speech normal.        Behavior: Behavior normal. Behavior is cooperative.        Thought Content: Thought content normal.        Cognition and  Memory: Cognition and memory normal.        Judgment: Judgment normal.       Assessment   1. Essential hypertension   2. Blepharitis of upper eyelids of both eyes, unspecified type     Tests ordered No orders of the defined types were placed in this encounter.    Plan: Please see assessment and plan per problem list above.   Meds ordered this encounter  Medications  . bacitracin-polymyxin b (POLYSPORIN) ophthalmic ointment    Sig: Place 1 application into both eyes 2 (two) times daily. apply to eye every 12 hours while awake    Dispense:  3.5 g    Refill:  0    Order Specific Question:   Supervising Provider    Answer:   NABEEHA, BADERTSCHER    Patient to follow-up in 06/24/2020.   Freddy Finner, NP

## 2020-03-20 NOTE — Assessment & Plan Note (Signed)
Andrea Maldonado is encouraged to maintain a well balanced diet that is low in salt. Controlled, continue current medication regimen.  Additionally, she is also reminded that exercise is beneficial for heart health and control of  Blood pressure. 30-60 minutes daily is recommended-walking was suggested.

## 2020-03-20 NOTE — Assessment & Plan Note (Signed)
S&S are blepharitis  -ointment ordered -cool compresses as needed -advised to call if not improved

## 2020-06-24 ENCOUNTER — Ambulatory Visit: Payer: Commercial Managed Care - PPO | Admitting: Nurse Practitioner

## 2020-07-01 ENCOUNTER — Encounter: Payer: Self-pay | Admitting: Nurse Practitioner

## 2020-07-01 ENCOUNTER — Other Ambulatory Visit: Payer: Self-pay

## 2020-07-01 ENCOUNTER — Ambulatory Visit: Payer: Commercial Managed Care - PPO | Admitting: Nurse Practitioner

## 2020-07-01 VITALS — BP 210/110 | HR 78 | Temp 98.0°F | Resp 20 | Ht 59.0 in | Wt 121.0 lb

## 2020-07-01 DIAGNOSIS — R070 Pain in throat: Secondary | ICD-10-CM | POA: Insufficient documentation

## 2020-07-01 DIAGNOSIS — I10 Essential (primary) hypertension: Secondary | ICD-10-CM | POA: Diagnosis not present

## 2020-07-01 MED ORDER — CLONIDINE HCL 0.2 MG PO TABS: 0.2000 mg | ORAL_TABLET | Freq: Three times a day (TID) | ORAL | 0 refills | Status: DC | PRN

## 2020-07-01 NOTE — Assessment & Plan Note (Signed)
-  BP elevated today, but she states her home readings have been great at home in the last week she checked and BP was 120/76 -Rx. PRN clonidine -she will keep BP log for the next week

## 2020-07-01 NOTE — Patient Instructions (Signed)
For throat issue, I sent a referral to ENT.  For blood pressure issue, I sent in clonidine to take when you check your BP. Take this up to three times per day when the top number is greater than 170 or the bottom number is greater than 100.  If you develop any bad headaches or neurological issues, like speech difficulty, weakness, or difficulty walking, go to the hospital immediately.

## 2020-07-01 NOTE — Progress Notes (Signed)
Acute Office Visit  Subjective:    Patient ID: Andrea Maldonado, female    DOB: Jan 09, 1966, 55 y.o.   MRN: [id number]  Chief Complaint  Patient presents with  . Hypertension    HPI Patient is in today for BP check. At her last OV, her BP was 144/80. She has been taking lisinopril-HCTZ and amlodipine daily. She states she check her BP last week and it was 120/76.  Today, her BP is 210/110 manually.  She states that she is in pain from a foreign object lodged in her throat. I don't see the object on exam today. She also works 3rd shift and has been up all night. She states she may also have a white coat syndrome component as well. Denies headaches.  Past Medical History:  Diagnosis Date  . Hypertension     History reviewed. No pertinent surgical history.  Family History  Problem Relation Age of Onset  . Breast cancer Mother     Social History   Socioeconomic History  . Marital status: Married    Spouse name: Fayrene Fearing   . Number of children: 1  . Years of education: Not on file  . Highest education level: Not on file  Occupational History    Comment: Paediatric nurse   Tobacco Use  . Smoking status: Never Smoker  . Smokeless tobacco: Never Used  Vaping Use  . Vaping Use: Never used  Substance and Sexual Activity  . Alcohol use: Never  . Drug use: Never  . Sexual activity: Yes    Birth control/protection: None  Other Topics Concern  . Not on file  Social History Narrative   Lives with Fayrene Fearing husband of 6 years    Daughter lives in [place]       Enjoys: movies       Diet: eats food groups    Caffeine: 2 cups coffee, tea once a day but not every day   Water: 4 cups daily       Wears seat belt   Does not drive    Smoke detectors    No weapons    Social Determinants of Health   Financial Resource Strain: Low Risk   . Difficulty of Paying Living Expenses: Not hard at all  Food Insecurity: No Food Insecurity  . Worried About Programme researcher, broadcasting/film/video in the  Last Year: Never true  . Ran Out of Food in the Last Year: Never true  Transportation Needs: No Transportation Needs  . Lack of Transportation (Medical): No  . Lack of Transportation (Non-Medical): No  Physical Activity: Insufficiently Active  . Days of Exercise per Week: 3 days  . Minutes of Exercise per Session: 30 min  Stress: No Stress Concern Present  . Feeling of Stress : Not at all  Social Connections: Moderately Integrated  . Frequency of Communication with Friends and Family: More than three times a week  . Frequency of Social Gatherings with Friends and Family: More than three times a week  . Attends Religious Services: More than 4 times per year  . Active Member of Clubs or Organizations: No  . Attends Banker Meetings: Never  . Marital Status: Married  Catering manager Violence: Not At Risk  . Fear of Current or Ex-Partner: No  . Emotionally Abused: No  . Physically Abused: No  . Sexually Abused: No    Outpatient Medications Prior to Visit  Medication Sig Dispense Refill  . amLODipine (NORVASC) 10 MG tablet Take 1  tablet (10 mg total) by mouth daily. 90 tablet 0  . bacitracin-polymyxin b (POLYSPORIN) ophthalmic ointment Place 1 application into both eyes 2 (two) times daily. apply to eye every 12 hours while awake 3.5 g 0  . estradiol (ESTRACE) 2 MG tablet Take 1 tablet (2 mg total) by mouth daily. 30 tablet 11  . lisinopril-hydrochlorothiazide (ZESTORETIC) 10-12.5 MG tablet Take 1 tablet by mouth daily. 90 tablet 3  . progesterone (PROMETRIUM) 200 MG capsule Take 1 capsule (200 mg total) by mouth daily. At bedtime 30 capsule 11   No facility-administered medications prior to visit.    No Known Allergies  Review of Systems  Constitutional: Negative.   HENT:       Pain to left neck/throat that has been ongoing; she states that she has a chicken bone lodged in her throat  Respiratory: Negative.   Cardiovascular: Negative.   Neurological: Negative.         Objective:    Physical Exam Constitutional:      Appearance: Normal appearance.  Cardiovascular:     Rate and Rhythm: Normal rate and regular rhythm.     Pulses: Normal pulses.     Heart sounds: Normal heart sounds.  Pulmonary:     Effort: Pulmonary effort is normal.     Breath sounds: Normal breath sounds.  Neurological:     General: No focal deficit present.     Mental Status: She is alert and oriented to person, place, and time.     Cranial Nerves: No cranial nerve deficit.     Sensory: No sensory deficit.     Motor: No weakness.     BP (!) 210/110   Pulse 78   Temp 98 F (36.7 C)   Resp 20   Ht 4\' 11"  (1.499 m)   Wt 121 lb (54.9 kg)   SpO2 96%   BMI 24.44 kg/m  Wt Readings from Last 3 Encounters:  07/01/20 121 lb (54.9 kg)  03/18/20 122 lb (55.3 kg)  02/10/20 119 lb 0.8 oz (54 kg)    Health Maintenance Due  Topic Date Due  . Hepatitis C Screening  Never done  . HIV Screening  Never done  . COLONOSCOPY (Pts 45-36yrs Insurance coverage will need to be confirmed)  Never done    There are no preventive care reminders to display for this patient.   No results found for: TSH Lab Results  Component Value Date   WBC 8.9 02/10/2020   HGB 14.7 02/10/2020   HCT 43.1 02/10/2020   MCV 91.3 02/10/2020   PLT 466 (H) 02/10/2020   Lab Results  Component Value Date   NA 138 02/10/2020   K 3.7 02/10/2020   CO2 28 02/10/2020   GLUCOSE 104 (H) 02/10/2020   BUN 14 02/10/2020   CREATININE 0.63 02/10/2020   BILITOT 0.8 02/10/2020   ALKPHOS 62 02/10/2020   AST 24 02/10/2020   ALT 20 02/10/2020   PROT 8.0 02/10/2020   ALBUMIN 4.3 02/10/2020   CALCIUM 9.3 02/10/2020   ANIONGAP 8 02/10/2020   No results found for: CHOL No results found for: HDL No results found for: LDLCALC No results found for: TRIG No results found for: CHOLHDL No results found for: 02/12/2020     Assessment & Plan:   Problem List Items Addressed This Visit      Cardiovascular and  Mediastinum   Essential hypertension    -BP elevated today, but she states her home readings have been great at  home in the last week she checked and BP was 120/76 -Rx. PRN clonidine -she will keep BP log for the next week      Relevant Medications   cloNIDine (CATAPRES) 0.2 MG tablet     Other   Throat pain - Primary    -she states that she has retained chicken bone in her throat that is causing pain that is getting worse -referral to ENT; may need oral surgery depending on location of foreing object, if it is present      Relevant Orders   Ambulatory referral to ENT       Meds ordered this encounter  Medications  . cloNIDine (CATAPRES) 0.2 MG tablet    Sig: Take 1 tablet (0.2 mg total) by mouth 3 (three) times daily as needed (for BP > 170/100).    Dispense:  30 tablet    Refill:  0     Heather Roberts, NP

## 2020-07-01 NOTE — Assessment & Plan Note (Signed)
-  she states that she has retained chicken bone in her throat that is causing pain that is getting worse -referral to ENT; may need oral surgery depending on location of foreing object, if it is present

## 2020-07-10 ENCOUNTER — Other Ambulatory Visit: Payer: Self-pay

## 2020-07-10 ENCOUNTER — Ambulatory Visit: Payer: Commercial Managed Care - PPO | Admitting: Nurse Practitioner

## 2020-07-23 ENCOUNTER — Other Ambulatory Visit: Payer: Self-pay

## 2020-07-23 ENCOUNTER — Encounter: Payer: Self-pay | Admitting: Nurse Practitioner

## 2020-07-23 ENCOUNTER — Ambulatory Visit (INDEPENDENT_AMBULATORY_CARE_PROVIDER_SITE_OTHER): Payer: Commercial Managed Care - PPO | Admitting: Nurse Practitioner

## 2020-07-23 DIAGNOSIS — I10 Essential (primary) hypertension: Secondary | ICD-10-CM | POA: Diagnosis not present

## 2020-07-23 NOTE — Patient Instructions (Signed)
You were referred to [facility] for your throat pain.  Their notes show that they called you multiple times with no response. If your throat is still bothering you, you should call their office at [phone] to set up an appointment.

## 2020-07-23 NOTE — Assessment & Plan Note (Addendum)
-  BP 162/92 manually; dynamap measured 187/102 -her home readings are great -has been taking lisinopril-HCTZ and amlodipine daily as well as PRN clonidine; she states she felt bad with clonidine, but I see no recorded BPs that require her to take the clonidine, so I think she took this inappropriately; D/C clonidine -she states that she gets nervous when in MD office; suspect she has white coat syndrome

## 2020-07-23 NOTE — Progress Notes (Signed)
Acute Office Visit  Subjective:    Patient ID: Andrea Maldonado, female    DOB: 03/28/1965, 55 y.o.   MRN: [id number]  Chief Complaint  Patient presents with  . Hypertension    HPI Patient is in today for BP check. At OV on 4/12, her BP was 210/110. She was advised to f/u in 1 week, but she did not.  At that time, I sent in clonidine to decrease her BP.  She brought in her home BP logs, and her BP has been running in the normal range (<140/90) for the last month as she has done BID checks.  Past Medical History:  Diagnosis Date  . Hypertension     History reviewed. No pertinent surgical history.  Family History  Problem Relation Age of Onset  . Breast cancer Mother     Social History   Socioeconomic History  . Marital status: Married    Spouse name: Fayrene Fearing   . Number of children: 1  . Years of education: Not on file  . Highest education level: Not on file  Occupational History    Comment: Paediatric nurse   Tobacco Use  . Smoking status: Never Smoker  . Smokeless tobacco: Never Used  Vaping Use  . Vaping Use: Never used  Substance and Sexual Activity  . Alcohol use: Never  . Drug use: Never  . Sexual activity: Yes    Birth control/protection: None  Other Topics Concern  . Not on file  Social History Narrative   Lives with Fayrene Fearing husband of 6 years    Daughter lives in [place]       Enjoys: movies       Diet: eats food groups    Caffeine: 2 cups coffee, tea once a day but not every day   Water: 4 cups daily       Wears seat belt   Does not drive    Smoke detectors    No weapons    Social Determinants of Health   Financial Resource Strain: Low Risk   . Difficulty of Paying Living Expenses: Not hard at all  Food Insecurity: No Food Insecurity  . Worried About Programme researcher, broadcasting/film/video in the Last Year: Never true  . Ran Out of Food in the Last Year: Never true  Transportation Needs: No Transportation Needs  . Lack of Transportation (Medical): No  .  Lack of Transportation (Non-Medical): No  Physical Activity: Insufficiently Active  . Days of Exercise per Week: 3 days  . Minutes of Exercise per Session: 30 min  Stress: No Stress Concern Present  . Feeling of Stress : Not at all  Social Connections: Moderately Integrated  . Frequency of Communication with Friends and Family: More than three times a week  . Frequency of Social Gatherings with Friends and Family: More than three times a week  . Attends Religious Services: More than 4 times per year  . Active Member of Clubs or Organizations: No  . Attends Banker Meetings: Never  . Marital Status: Married  Catering manager Violence: Not At Risk  . Fear of Current or Ex-Partner: No  . Emotionally Abused: No  . Physically Abused: No  . Sexually Abused: No    Outpatient Medications Prior to Visit  Medication Sig Dispense Refill  . amLODipine (NORVASC) 10 MG tablet Take 1 tablet (10 mg total) by mouth daily. 90 tablet 0  . estradiol (ESTRACE) 2 MG tablet Take 1 tablet (2 mg total) by  mouth daily. 30 tablet 11  . lisinopril-hydrochlorothiazide (ZESTORETIC) 10-12.5 MG tablet Take 1 tablet by mouth daily. 90 tablet 3  . progesterone (PROMETRIUM) 200 MG capsule Take 1 capsule (200 mg total) by mouth daily. At bedtime 30 capsule 11  . bacitracin-polymyxin b (POLYSPORIN) ophthalmic ointment Place 1 application into both eyes 2 (two) times daily. apply to eye every 12 hours while awake 3.5 g 0  . cloNIDine (CATAPRES) 0.2 MG tablet Take 1 tablet (0.2 mg total) by mouth 3 (three) times daily as needed (for BP > 170/100). 30 tablet 0   No facility-administered medications prior to visit.    No Known Allergies  Review of Systems  Constitutional: Negative.   Respiratory: Negative.   Cardiovascular: Negative.   Neurological: Negative.   Psychiatric/Behavioral: Negative.        Objective:    Physical Exam Constitutional:      Appearance: Normal appearance.  Cardiovascular:      Rate and Rhythm: Normal rate and regular rhythm.     Pulses: Normal pulses.     Heart sounds: Normal heart sounds.  Pulmonary:     Effort: Pulmonary effort is normal.     Breath sounds: Normal breath sounds.  Neurological:     Mental Status: She is alert.  Psychiatric:        Mood and Affect: Mood normal.        Behavior: Behavior normal.        Thought Content: Thought content normal.        Judgment: Judgment normal.     BP (!) 187/102   Pulse 87   Temp 97.8 F (36.6 C)   Resp 20   Ht 4\' 11"  (1.499 m)   Wt 120 lb (54.4 kg)   SpO2 92%   BMI 24.24 kg/m  Wt Readings from Last 3 Encounters:  07/23/20 120 lb (54.4 kg)  07/01/20 121 lb (54.9 kg)  03/18/20 122 lb (55.3 kg)    Health Maintenance Due  Topic Date Due  . Hepatitis C Screening  Never done  . HIV Screening  Never done  . COLONOSCOPY (Pts 45-51yrs Insurance coverage will need to be confirmed)  Never done    There are no preventive care reminders to display for this patient.   No results found for: TSH Lab Results  Component Value Date   WBC 8.9 02/10/2020   HGB 14.7 02/10/2020   HCT 43.1 02/10/2020   MCV 91.3 02/10/2020   PLT 466 (H) 02/10/2020   Lab Results  Component Value Date   NA 138 02/10/2020   K 3.7 02/10/2020   CO2 28 02/10/2020   GLUCOSE 104 (H) 02/10/2020   BUN 14 02/10/2020   CREATININE 0.63 02/10/2020   BILITOT 0.8 02/10/2020   ALKPHOS 62 02/10/2020   AST 24 02/10/2020   ALT 20 02/10/2020   PROT 8.0 02/10/2020   ALBUMIN 4.3 02/10/2020   CALCIUM 9.3 02/10/2020   ANIONGAP 8 02/10/2020   No results found for: CHOL No results found for: HDL No results found for: LDLCALC No results found for: TRIG No results found for: CHOLHDL No results found for: 02/12/2020     Assessment & Plan:   Problem List Items Addressed This Visit      Cardiovascular and Mediastinum   Essential hypertension    -BP 162/92 manually; dynamap measured 187/102 -her home readings are great -has been  taking lisinopril-HCTZ and amlodipine daily as well as PRN clonidine; she states she felt bad with clonidine,  but I see no recorded BPs that require her to take the clonidine, so I think she took this inappropriately; D/C clonidine -she states that she gets nervous when in MD office; suspect she has white coat syndrome            No orders of the defined types were placed in this encounter.    Heather Roberts, NP

## 2020-12-16 ENCOUNTER — Other Ambulatory Visit: Payer: Self-pay

## 2020-12-16 DIAGNOSIS — I10 Essential (primary) hypertension: Secondary | ICD-10-CM

## 2020-12-16 MED ORDER — AMLODIPINE BESYLATE 10 MG PO TABS: 10.0000 mg | ORAL_TABLET | Freq: Every day | ORAL | 0 refills | Status: DC

## 2021-01-20 ENCOUNTER — Other Ambulatory Visit: Payer: Self-pay

## 2021-01-20 DIAGNOSIS — I10 Essential (primary) hypertension: Secondary | ICD-10-CM

## 2021-01-20 MED ORDER — LISINOPRIL-HYDROCHLOROTHIAZIDE 10-12.5 MG PO TABS: 1.0000 | ORAL_TABLET | Freq: Every day | ORAL | 3 refills | Status: AC

## 2021-01-22 ENCOUNTER — Encounter: Payer: Commercial Managed Care - PPO | Admitting: Nurse Practitioner

## 2021-01-22 ENCOUNTER — Encounter: Payer: Commercial Managed Care - PPO | Admitting: Family Medicine

## 2021-03-10 IMAGING — MG DIGITAL SCREENING BILAT W/ CAD
8 series · 8 of 24 positions shown · non-contrast
Comparison: Previous exam(s).

CLINICAL DATA: Screening.

EXAM:
DIGITAL SCREENING BILATERAL MAMMOGRAM WITH CAD

[L MLO synth-2D]
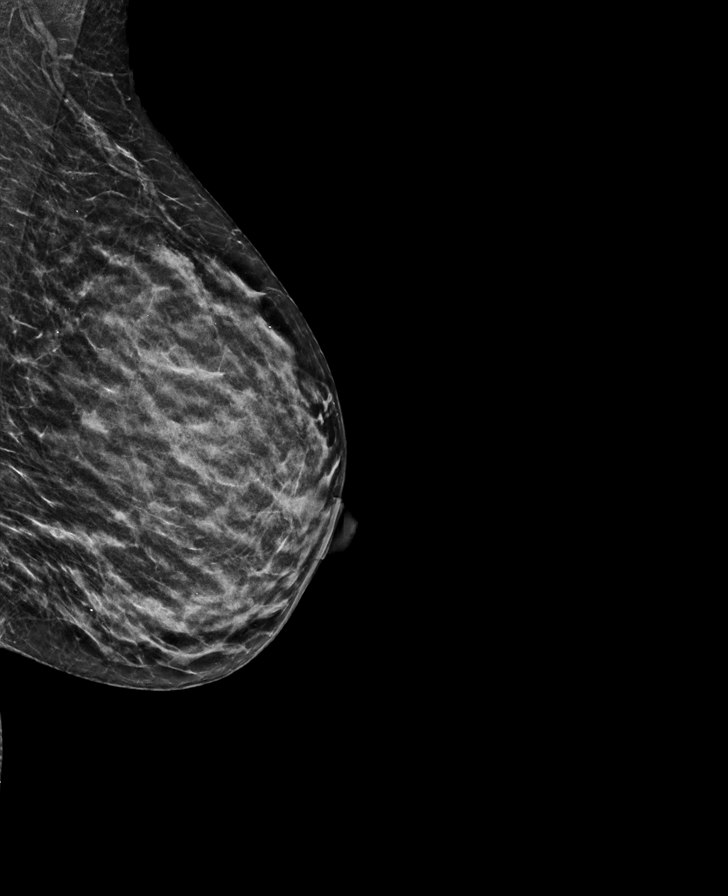

[L CC synth-2D]
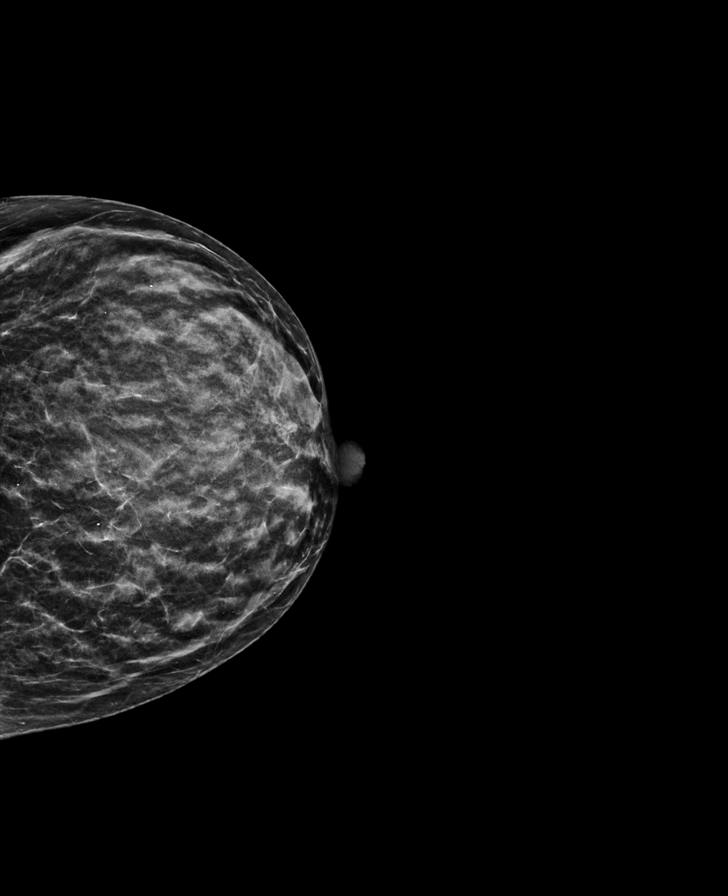

[R CC synth-2D]
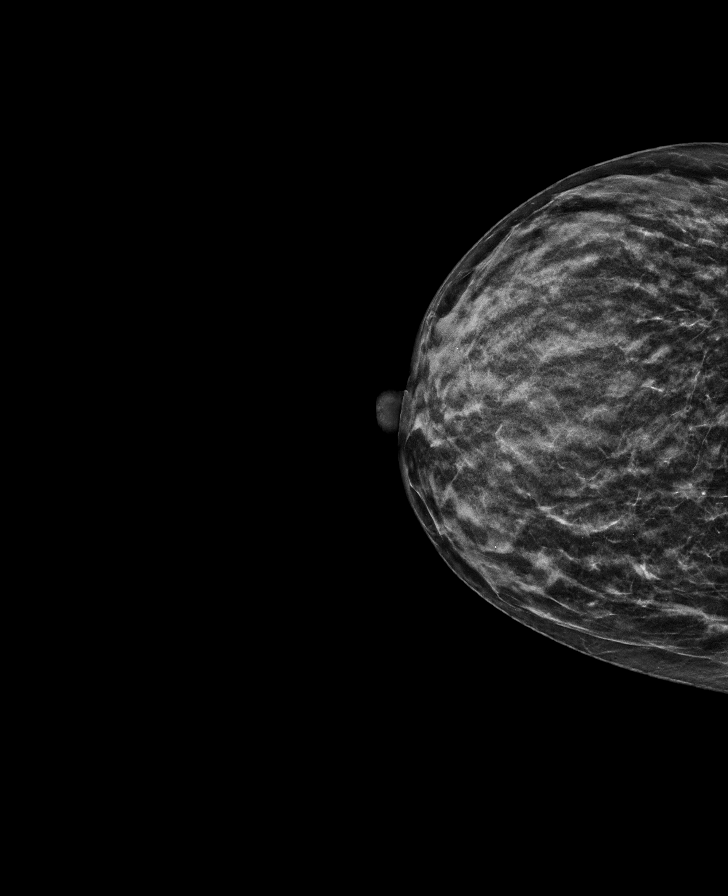

[R MLO synth-2D]
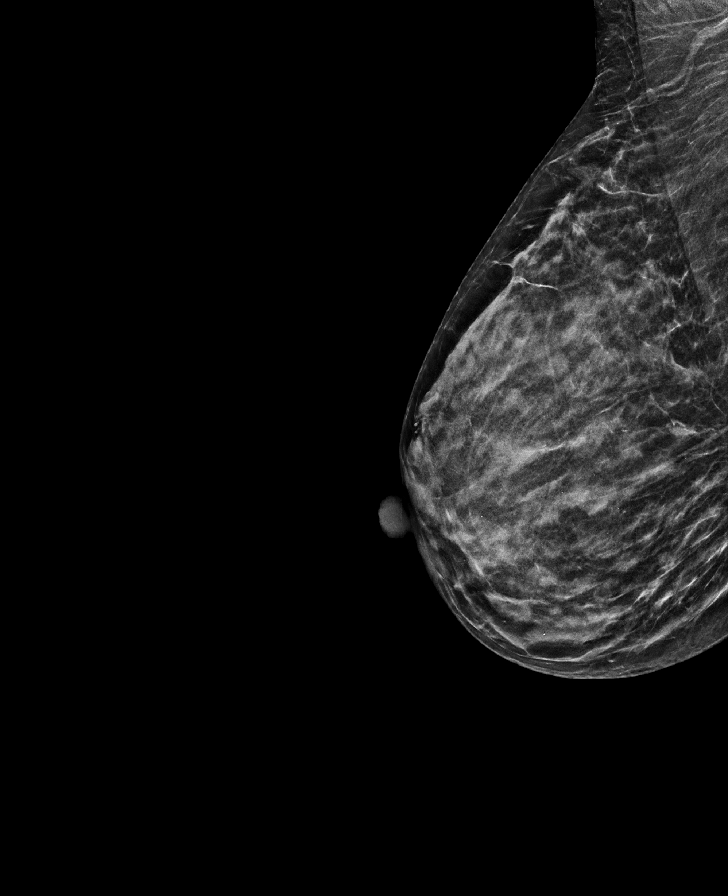

[L CC tomo · tomo slice 21/40.0]
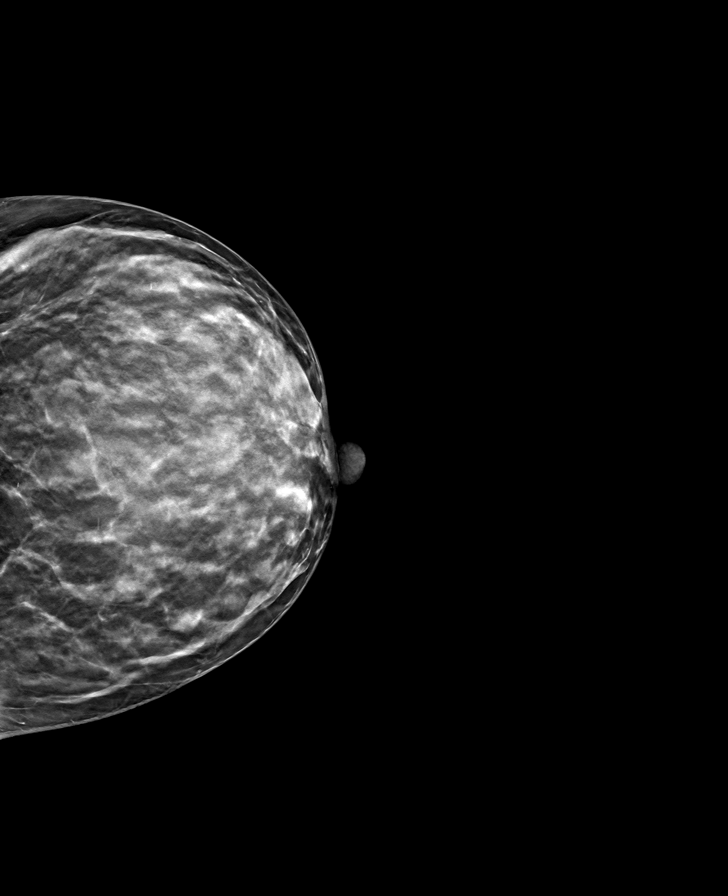

[L MLO tomo · tomo slice 22/43.0]
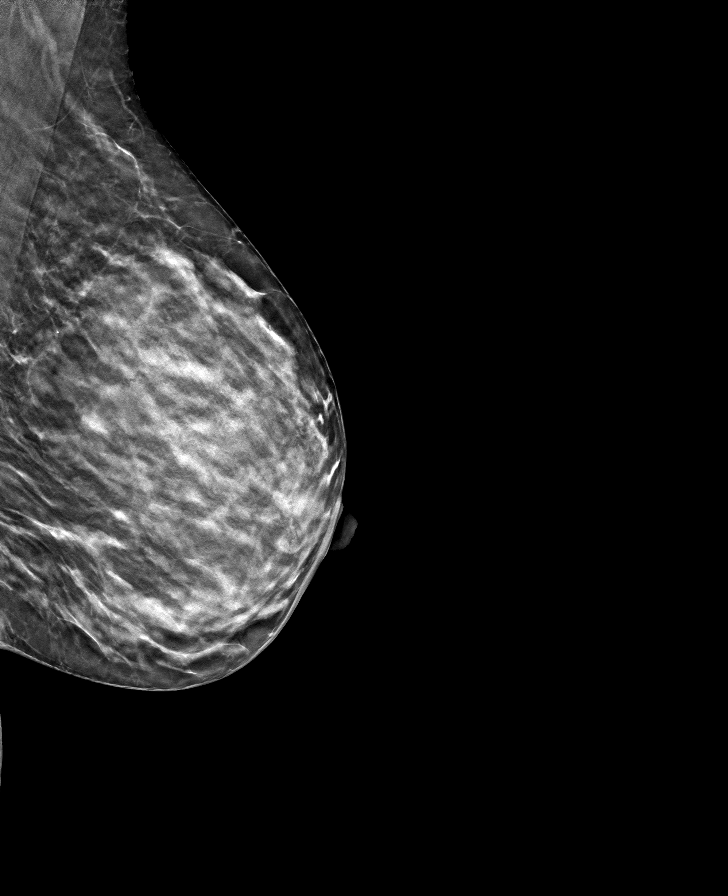

[R MLO tomo · tomo slice 22/43.0]
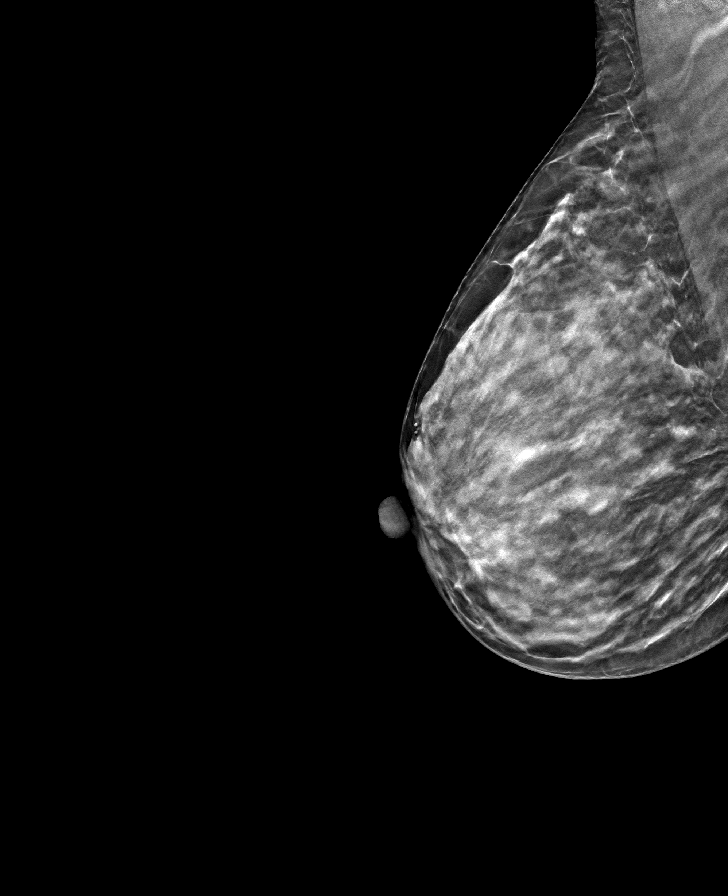

[R CC tomo · tomo slice 20/39.0]
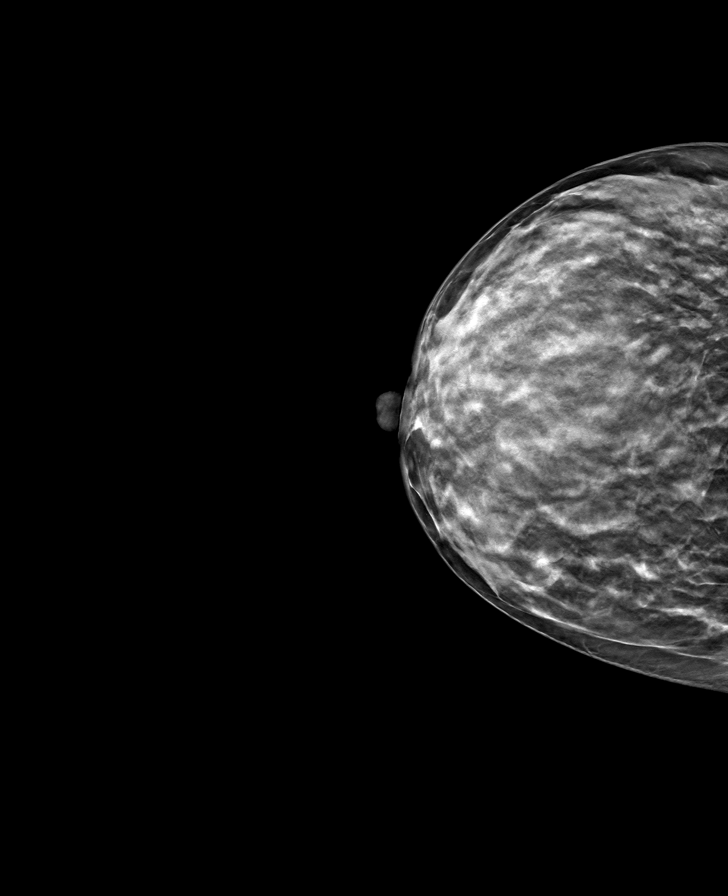

[8 of 24 positions shown; findings below may reference images not displayed]

ACR Breast Density Category c: The breast tissue is heterogeneously
dense, which may obscure small masses.
FINDINGS: There are no findings suspicious for malignancy. Images were
processed with CAD.
IMPRESSION: No mammographic evidence of malignancy. A result letter of this
screening mammogram will be mailed directly to the patient.

RECOMMENDATION:
Screening mammogram in one year. (Code:[id number])

BI-RADS CATEGORY  1: Negative.

## 2021-03-30 ENCOUNTER — Other Ambulatory Visit: Payer: Self-pay | Admitting: Nurse Practitioner

## 2021-03-30 DIAGNOSIS — I10 Essential (primary) hypertension: Secondary | ICD-10-CM

## 2021-04-27 ENCOUNTER — Other Ambulatory Visit: Payer: Self-pay | Admitting: Family Medicine

## 2021-04-27 DIAGNOSIS — I10 Essential (primary) hypertension: Secondary | ICD-10-CM

## 2021-05-24 ENCOUNTER — Other Ambulatory Visit: Payer: Self-pay | Admitting: Family Medicine

## 2021-05-24 DIAGNOSIS — I10 Essential (primary) hypertension: Secondary | ICD-10-CM

## 2021-05-29 ENCOUNTER — Other Ambulatory Visit: Payer: Self-pay | Admitting: Family Medicine

## 2021-05-29 DIAGNOSIS — I10 Essential (primary) hypertension: Secondary | ICD-10-CM

## 2022-07-14 ENCOUNTER — Ambulatory Visit: Payer: Commercial Managed Care - PPO | Attending: Audiologist | Admitting: Audiologist

## 2022-07-14 DIAGNOSIS — H7293 Unspecified perforation of tympanic membrane, bilateral: Secondary | ICD-10-CM | POA: Diagnosis not present

## 2022-07-14 DIAGNOSIS — H900 Conductive hearing loss, bilateral: Secondary | ICD-10-CM | POA: Diagnosis present

## 2022-07-14 NOTE — Procedures (Signed)
  [facility] [address] [place], [place]  [address] [phone]  AUDIOLOGICAL  EVALUATION  NAME: Andrea Maldonado     DOB:   Jan 24, 1966      MRN: [id number]                                                                                     DATE: 07/14/2022     REFERENT: Heather Roberts, NP STATUS: Outpatient DIAGNOSIS: Mixed Hearing Loss Left Ear, Conductive Hearing Loss Right ear, Eardrum Perforation Bilateral    History: [name] was seen for an audiological evaluation. Crystall was accompanied to the appointment by her husband. [name] is receiving a hearing evaluation due to concerns for difficulty hearing on a daily basis for a long time . [name] has difficulty hearing people clearly. This difficulty began gradually. No pain or pressure reported in either ear. Tinnitus present in both ears. [name] has a history of noise exposure from work and has her hearing tested by [employer] regularly. She has never been told by work that she has hearing loss. She does not know if she had ear infections as a child or family history of loss. No other relevant case history reported. She reported throat pain from a bone in her throat as well.   Evaluation:  Otoscopy showed a clear view of the tympanic membranes which have large perforations and scarring bilaterally Tympanometry results were consistent with large volume and flat response consistent with perforations  Audiometric testing was completed using conventional audiometry with insert and supraural transducer. Speech Recognition Thresholds were 35dB in the right ear and 60dB in the left ear. Testing performed in English which is [name]'s second langugae. Word Recognition was  performed 90dB in the left ear and 75dB in the right ear, scored 84% in the right ear and 100% in the left ear. Pure tone thresholds show moderate conductive hearing loss in the right ear and moderate mixed hearing loss in the left ear.   Results:   The test results were reviewed with [facility] and her husband. [name] has perforated eardrums in each ear. This is contributing to her hearing loss. [name] has conductive hearing loss in each ear which needs to be addressed by a medical provider. She needs to see Otolaryngology to see if the perforations are something that can be addressed.     Recommendations: 1.   Referral to ENT Physician necessary due to bilateral perforated eardrums and mixed hearing loss.    36 minutes spent testing and counseling on results.   [name]  Audiologist, Au.D., CCC-A 07/14/2022  9:27 AM  Cc: Heather Roberts, NP

## 2023-07-28 ENCOUNTER — Other Ambulatory Visit (HOSPITAL_COMMUNITY): Payer: Self-pay | Admitting: Family

## 2023-07-28 DIAGNOSIS — Z1231 Encounter for screening mammogram for malignant neoplasm of breast: Secondary | ICD-10-CM

## 2023-08-12 ENCOUNTER — Other Ambulatory Visit (HOSPITAL_COMMUNITY): Payer: Self-pay | Admitting: Family

## 2023-08-12 ENCOUNTER — Encounter (HOSPITAL_COMMUNITY): Payer: Self-pay

## 2023-08-12 ENCOUNTER — Ambulatory Visit (HOSPITAL_COMMUNITY)
Admission: RE | Admit: 2023-08-12 | Discharge: 2023-08-12 | Disposition: A | Discharge status: Home / Self Care | Source: Ambulatory Visit | Attending: Family | Admitting: Family

## 2023-08-12 DIAGNOSIS — Z1231 Encounter for screening mammogram for malignant neoplasm of breast: Secondary | ICD-10-CM | POA: Diagnosis present

## 2023-11-10 ENCOUNTER — Encounter (INDEPENDENT_AMBULATORY_CARE_PROVIDER_SITE_OTHER): Payer: Self-pay | Admitting: *Deleted

## 2023-12-01 ENCOUNTER — Telehealth: Payer: Self-pay

## 2023-12-01 NOTE — Telephone Encounter (Signed)
 Who is your primary care physician: Joelin Vineetha   Reasons for the colonoscopy: screening   Have you had a colonoscopy before?  no  Do you have family history of colon cancer? no  Previous colonoscopy with polyps removed? no  Do you have a history colorectal cancer?   no  Are you diabetic? If yes, Type 1 or Type 2?    no  Do you have a prosthetic or mechanical heart valve? no  Do you have a pacemaker/defibrillator?   no  Have you had endocarditis/atrial fibrillation? no  Have you had joint replacement within the last 12 months?  no  Do you tend to be constipated or have to use laxatives? no  Do you have any history of drugs or alchohol?  no  Do you use supplemental oxygen?  no  Have you had a stroke or heart attack within the last 6 months? no  Do you take weight loss medication?  no  For female patients: have you had a hysterectomy?  no                                     are you post menopausal?       yes                                            do you still have your menstrual cycle? no      Do you take any blood-thinning medications such as: (aspirin, warfarin, Plavix, Aggrenox)  no  If yes we need the name, milligram, dosage and who is prescribing doctor  Current Outpatient Medications on File Prior to Visit  Medication Sig Dispense Refill   amLODipine  (NORVASC ) 10 MG tablet Take 1 tablet by mouth once daily 30 tablet 0   lisinopril -hydrochlorothiazide  (ZESTORETIC ) 10-12.5 MG tablet Take 1 tablet by mouth daily. 90 tablet 3   No current facility-administered medications on file prior to visit.    No Known Allergies   [facility]   Primary Insurance Name: VONDA [id number]  Best number where you can be reached: [phone]

## 2023-12-16 NOTE — Telephone Encounter (Signed)
ASA 2.  ?

## 2023-12-19 NOTE — Telephone Encounter (Signed)
 LMOVM to call back

## 2023-12-21 ENCOUNTER — Encounter (INDEPENDENT_AMBULATORY_CARE_PROVIDER_SITE_OTHER): Payer: Self-pay | Admitting: *Deleted

## 2023-12-21 MED ORDER — PEG 3350-KCL-NA BICARB-NACL 420 G PO SOLR: 4000.0000 mL | Freq: Once | ORAL | 0 refills | Status: AC

## 2023-12-21 NOTE — Telephone Encounter (Signed)
 Referral completed, [facility]CS apt letter sent to PCP

## 2023-12-21 NOTE — Telephone Encounter (Addendum)
 Spoke with pt. She gave verbal permission to schedule with her spouse Lynwood. She then gave the phone to him. Scheduled for 10/23. Aware will send instructions in the mail and rx for prep to pharmacy.   PA approved via [employer] for colonoscopy Authorization #: [id number] DOS: 12/21/23-02/20/24

## 2023-12-21 NOTE — Addendum Note (Signed)
 Addended by: JEANELL GRAEME RAMAN on: 12/21/2023 09:59 AM   Modules accepted: Orders

## 2024-01-12 ENCOUNTER — Encounter (HOSPITAL_COMMUNITY): Admission: RE | Disposition: A | Payer: Self-pay | Source: Home / Self Care | Attending: Internal Medicine

## 2024-01-12 ENCOUNTER — Ambulatory Visit (HOSPITAL_COMMUNITY)
Admission: RE | Admit: 2024-01-12 | Discharge: 2024-01-12 | Disposition: A | Discharge status: Home / Self Care | Attending: Internal Medicine | Admitting: Internal Medicine

## 2024-01-12 ENCOUNTER — Ambulatory Visit (HOSPITAL_COMMUNITY): Admitting: Anesthesiology

## 2024-01-12 ENCOUNTER — Encounter (HOSPITAL_COMMUNITY): Payer: Self-pay | Admitting: Internal Medicine

## 2024-01-12 ENCOUNTER — Other Ambulatory Visit: Payer: Self-pay

## 2024-01-12 DIAGNOSIS — K649 Unspecified hemorrhoids: Secondary | ICD-10-CM

## 2024-01-12 DIAGNOSIS — I10 Essential (primary) hypertension: Secondary | ICD-10-CM

## 2024-01-12 DIAGNOSIS — Z1211 Encounter for screening for malignant neoplasm of colon: Secondary | ICD-10-CM | POA: Insufficient documentation

## 2024-01-12 DIAGNOSIS — K641 Second degree hemorrhoids: Secondary | ICD-10-CM | POA: Diagnosis not present

## 2024-01-12 HISTORY — PX: COLONOSCOPY: SHX5424

## 2024-01-12 SURGERY — COLONOSCOPY
Anesthesia: General

## 2024-01-12 MED ORDER — PROPOFOL 10 MG/ML IV BOLUS
INTRAVENOUS | Status: DC | PRN
  Administered 2024-01-12: 100 mg via INTRAVENOUS

## 2024-01-12 MED ORDER — PROPOFOL 500 MG/50ML IV EMUL
INTRAVENOUS | Status: DC | PRN
  Administered 2024-01-12: 200 ug/kg/min via INTRAVENOUS

## 2024-01-12 MED ORDER — LACTATED RINGERS IV SOLN: INTRAVENOUS | Status: DC | PRN

## 2024-01-12 NOTE — Discharge Instructions (Addendum)
  Colonoscopy Discharge Instructions  Read the instructions outlined below and refer to this sheet in the next few weeks. These discharge instructions provide you with general information on caring for yourself after you leave the hospital. Your doctor may also give you specific instructions. While your treatment has been planned according to the most current medical practices available, unavoidable complications occasionally occur. If you have any problems or questions after discharge, call Dr. Shaaron at [phone]. ACTIVITY You may resume your regular activity, but move at a slower pace for the next 24 hours.  Take frequent rest periods for the next 24 hours.  Walking will help get rid of the air and reduce the bloated feeling in your belly (abdomen).  No driving for 24 hours (because of the medicine (anesthesia) used during the test).   Do not sign any important legal documents or operate any machinery for 24 hours (because of the anesthesia used during the test).  NUTRITION Drink plenty of fluids.  You may resume your normal diet as instructed by your doctor.  Begin with a light meal and progress to your normal diet. Heavy or fried foods are harder to digest and may make you feel sick to your stomach (nauseated).  Avoid alcoholic beverages for 24 hours or as instructed.  MEDICATIONS You may resume your normal medications unless your doctor tells you otherwise.  WHAT YOU CAN EXPECT TODAY Some feelings of bloating in the abdomen.  Passage of more gas than usual.  Spotting of blood in your stool or on the toilet paper.  IF YOU HAD POLYPS REMOVED DURING THE COLONOSCOPY: No aspirin products for 7 days or as instructed.  No alcohol for 7 days or as instructed.  Eat a soft diet for the next 24 hours.  FINDING OUT THE RESULTS OF YOUR TEST Not all test results are available during your visit. If your test results are not back during the visit, make an appointment with your caregiver to find out the  results. Do not assume everything is normal if you have not heard from your caregiver or the medical facility. It is important for you to follow up on all of your test results.  SEEK IMMEDIATE MEDICAL ATTENTION IF: You have more than a spotting of blood in your stool.  Your belly is swollen (abdominal distention).  You are nauseated or vomiting.  You have a temperature over 101.  You have abdominal pain or discomfort that is severe or gets worse throughout the day.      no polyps found today   I recommend a repeat colonoscopy in 10 years

## 2024-01-12 NOTE — Transfer of Care (Signed)
 Immediate Anesthesia Transfer of Care Note  Patient: Andrea Maldonado  Procedure(s) Performed: COLONOSCOPY  Patient Location: Endoscopy Unit  Anesthesia Type:General  Level of Consciousness: awake, alert , oriented, and patient cooperative  Airway & Oxygen Therapy: Patient Spontanous Breathing  Post-op Assessment: Report given to RN, Post -op Vital signs reviewed and stable, and Patient moving all extremities X 4  Post vital signs: Reviewed and stable  Last Vitals:  Vitals Value Taken Time  BP 83/50 01/12/24 11:02  Temp 36.6 C 01/12/24 11:02  Pulse 86 01/12/24 11:02  Resp 18 01/12/24 11:02  SpO2 97 % 01/12/24 11:02    Last Pain:  Vitals:   01/12/24 1102  TempSrc: Oral  PainSc: 0-No pain      Patients Stated Pain Goal: 3 (01/12/24 0912)  Complications: No notable events documented.

## 2024-01-12 NOTE — Op Note (Signed)
 [facility] Patient Name: Andrea Maldonado Procedure Date: 01/12/2024 10:17 AM MRN: [id number] Date of Birth: [birth year] Attending MD: Andrea Maldonado , MD, [id number] CSN: [id number] Age: 58 Admit Type: Outpatient Procedure:                Colonoscopy Indications:              Screening for colorectal malignant neoplasm Providers:                Andrea Ozell Hollingshead, MD, Leandrew Edelman RN, RN, Bascom Blush Referring MD:              Medicines:                Propofol per Anesthesia Complications:            No immediate complications. Estimated Blood Loss:     Estimated blood loss: none. Procedure:                Pre-Anesthesia Assessment:                           - Prior to the procedure, a History and Physical                            was performed, and patient medications and                            allergies were reviewed. The patient's tolerance of                            previous anesthesia was also reviewed. The risks                            and benefits of the procedure and the sedation                            options and risks were discussed with the patient.                            All questions were answered, and informed consent                            was obtained. ASA Grade Assessment: II - A patient                            with mild systemic disease. After reviewing the                            risks and benefits, the patient was deemed in                            satisfactory condition to undergo the procedure.  After obtaining informed consent, the colonoscope                            was passed under direct vision. Throughout the                            procedure, the patient's blood pressure, pulse, and                            oxygen saturations were monitored continuously. The                            [id number] ([id number]) Colon was introduced through                             the anus and advanced to the the cecum, identified                            by appendiceal orifice and ileocecal valve. The                            colonoscopy was performed without difficulty. The                            patient tolerated the procedure well. The quality                            of the bowel preparation was adequate. The                            colonoscopy was performed without difficulty. The                            patient tolerated the procedure well. The quality                            of the bowel preparation was adequate. Scope In: 10:42:46 AM Scope Out: 10:57:54 AM Scope Withdrawal Time: 0 hours 8 minutes 6 seconds  Total Procedure Duration: 0 hours 15 minutes 8 seconds  Findings:      The perianal and digital rectal examinations were normal.      Non-bleeding internal hemorrhoids were found during retroflexion. The       hemorrhoids were moderate, medium-sized and Grade II (internal       hemorrhoids that prolapse but reduce spontaneously).      The exam was otherwise without abnormality on direct and retroflexion       views. Impression:               - Non-bleeding internal hemorrhoids.                           - The examination was otherwise normal on direct                            and retroflexion views.                           -  No specimens collected. Moderate Sedation:      Moderate (conscious) sedation was personally administered by an       anesthesia professional. The following parameters were monitored: oxygen       saturation, heart rate, blood pressure, respiratory rate, EKG, adequacy       of pulmonary ventilation, and response to care. Recommendation:           - Patient has a contact number available for                            emergencies. The signs and symptoms of potential                            delayed complications were discussed with the                            patient. Return to normal activities  tomorrow.                            Written discharge instructions were provided to the                            patient.                           - Advance diet as tolerated.                           - Continue present medications.                           - Repeat colonoscopy in 10 years for screening                            purposes.                           - Return to GI office (date not yet determined). Procedure Code(s):        --- Professional ---                           [phone], Colonoscopy, flexible; diagnostic, including                            collection of specimen(s) by brushing or washing,                            when performed (separate procedure) Diagnosis Code(s):        --- Professional ---                           Z12.11, Encounter for screening for malignant                            neoplasm of colon  K64.1, Second degree hemorrhoids CPT copyright 2022 American Medical Association. All rights reserved. The codes documented in this report are preliminary and upon coder review may  be revised to meet current compliance requirements. Andrea HERO. Javae Braaten, MD Andrea Ozell Hollingshead, MD 01/12/2024 11:03:49 AM This report has been signed electronically. Number of Addenda: 0

## 2024-01-12 NOTE — H&P (Signed)
 @LOGO @   Primary Care Physician:  Joelin, Vineetha, NP Primary Gastroenterologist:  Dr. Shaaron  Pre-Procedure History & Physical: HPI:  Andrea Maldonado is a 58 y.o. female is here for a screening colonoscopy.  first-ever average risk screening examination  Past Medical History:  Diagnosis Date   Hypertension     History reviewed. No pertinent surgical history.  Prior to Admission medications   Medication Sig Start Date End Date Taking? Authorizing Provider  amLODipine  (NORVASC ) 10 MG tablet Take 1 tablet by mouth once daily 04/27/21  Yes Pesa Rollene BRAVO, MD  lisinopril -hydrochlorothiazide  (ZESTORETIC ) 10-12.5 MG tablet Take 1 tablet by mouth daily. 01/20/21  Yes Elnor Fairy HERO, NP    Allergies as of 12/21/2023   (No Known Allergies)    Family History  Problem Relation Age of Onset   Breast cancer Mother     Social History   Socioeconomic History   Marital status: Married    Spouse name: Lynwood    Number of children: 1   Years of education: Not on file   Highest education level: Not on file  Occupational History    Comment: Paediatric nurse   Tobacco Use   Smoking status: Never   Smokeless tobacco: Never  Vaping Use   Vaping status: Never Used  Substance and Sexual Activity   Alcohol use: Never   Drug use: Never   Sexual activity: Yes    Birth control/protection: None  Other Topics Concern   Not on file  Social History Narrative   Lives with Lynwood husband of 6 years    Daughter lives in [place]       Enjoys: movies       Diet: eats food groups    Caffeine: 2 cups coffee, tea once a day but not every day   Water: 4 cups daily       Wears seat belt   Does not drive    Smoke detectors    No weapons    Social Drivers of Corporate investment banker Strain: Low Risk  (10/26/2019)   Overall Financial Resource Strain (CARDIA)    Difficulty of Paying Living Expenses: Not hard at all  Food Insecurity: No Food Insecurity (10/26/2019)   Hunger Vital Sign     Worried About Running Out of Food in the Last Year: Never true    Ran Out of Food in the Last Year: Never true  Transportation Needs: No Transportation Needs (10/26/2019)   PRAPARE - Administrator, Civil Service (Medical): No    Lack of Transportation (Non-Medical): No  Physical Activity: Insufficiently Active (10/26/2019)   Exercise Vital Sign    Days of Exercise per Week: 3 days    Minutes of Exercise per Session: 30 min  Stress: No Stress Concern Present (10/26/2019)   [employer] of Occupational Health - Occupational Stress Questionnaire    Feeling of Stress : Not at all  Social Connections: Moderately Integrated (10/26/2019)   Social Connection and Isolation Panel    Frequency of Communication with Friends and Family: More than three times a week    Frequency of Social Gatherings with Friends and Family: More than three times a week    Attends Religious Services: More than 4 times per year    Active Member of [employer] or Organizations: No    Attends Banker Meetings: Never    Marital Status: Married  Catering manager Violence: Not At Risk (10/26/2019)   Humiliation, Afraid, Rape, and Kick  questionnaire    Fear of Current or Ex-Partner: No    Emotionally Abused: No    Physically Abused: No    Sexually Abused: No    Review of Systems: See HPI, otherwise negative ROS  Physical Exam: BP (!) 171/89   Pulse 89   Temp 98.2 F (36.8 C) (Oral)   Resp 18   Ht 4' 11 (1.499 m)   Wt 54.4 kg   SpO2 96%   BMI 24.24 kg/m  General:   Alert,  Well-developed, well-nourished, pleasant and cooperative in NAD Neck:  Supple; no masses or thyromegaly. Lungs:  Clear throughout to auscultation.   No wheezes, crackles, or rhonchi. No acute distress. Heart:  Regular rate and rhythm; no murmurs, clicks, rubs,  or gallops. Abdomen:  Soft, nontender and nondistended. No masses, hepatosplenomegaly or hernias noted. Normal bowel sounds, without guarding, and without rebound.     Impression/Plan: [name] is now here to undergo a screening colonoscopy.   first ever average risk screening examination.  Risks, benefits, limitations, imponderables and alternatives regarding colonoscopy have been reviewed with the patient. Questions have been answered. All parties agreeable.     Notice:  This dictation was prepared with Dragon dictation along with smaller phrase technology. Any transcriptional errors that result from this process are unintentional and may not be corrected upon review.

## 2024-01-12 NOTE — OR Nursing (Incomplete)
 Andrea Maldonado had a procedure at [facility] on 01/12/24 and cannot return to work until 01/13/23.

## 2024-01-12 NOTE — Anesthesia Preprocedure Evaluation (Signed)

## 2024-01-13 ENCOUNTER — Encounter (HOSPITAL_COMMUNITY): Payer: Self-pay | Admitting: Internal Medicine

## 2024-01-13 NOTE — Anesthesia Postprocedure Evaluation (Signed)
 Anesthesia Post Note  Patient: Andrea Maldonado  Procedure(s) Performed: COLONOSCOPY  Patient location during evaluation: Phase II Anesthesia Type: General Level of consciousness: awake Pain management: pain level controlled Vital Signs Assessment: post-procedure vital signs reviewed and stable Respiratory status: spontaneous breathing and respiratory function stable Cardiovascular status: blood pressure returned to baseline and stable Postop Assessment: no headache and no apparent nausea or vomiting Anesthetic complications: no Comments: Late entry   No notable events documented.   Last Vitals:  Vitals:   01/12/24 1107 01/12/24 1112  BP: (!) 101/54   Pulse: 81 73  Resp: 19 19  Temp:    SpO2: 99% 98%    Last Pain:  Vitals:   01/12/24 1107  TempSrc:   PainSc: 0-No pain                 Andrea Maldonado
